# Patient Record
Sex: Male | Born: 1950 | Race: White | Hispanic: No | Marital: Married | Smoking: Never smoker
Health system: Southern US, Academic
[De-identification: ages and names within clinical notes are randomized; demographics above are authoritative.]

## PROBLEM LIST (undated history)

## (undated) DIAGNOSIS — I4891 Unspecified atrial fibrillation: Secondary | ICD-10-CM

## (undated) DIAGNOSIS — I10 Essential (primary) hypertension: Secondary | ICD-10-CM

## (undated) HISTORY — DX: Unspecified atrial fibrillation (CMS HCC): I48.91

## (undated) HISTORY — PX: HX NOSE/SINUS SURGERY: 2100001179

## (undated) HISTORY — DX: Essential (primary) hypertension: I10

---

## 1998-07-07 ENCOUNTER — Ambulatory Visit (INDEPENDENT_AMBULATORY_CARE_PROVIDER_SITE_OTHER): Payer: Self-pay

## 1998-07-19 ENCOUNTER — Ambulatory Visit (HOSPITAL_COMMUNITY): Payer: Self-pay

## 1998-07-28 ENCOUNTER — Ambulatory Visit (HOSPITAL_COMMUNITY): Payer: Self-pay

## 1998-11-27 ENCOUNTER — Ambulatory Visit (INDEPENDENT_AMBULATORY_CARE_PROVIDER_SITE_OTHER): Payer: Self-pay

## 1999-03-27 ENCOUNTER — Emergency Department (HOSPITAL_COMMUNITY): Payer: Self-pay

## 1999-03-30 ENCOUNTER — Ambulatory Visit (HOSPITAL_COMMUNITY): Payer: Self-pay

## 1999-03-30 ENCOUNTER — Ambulatory Visit (INDEPENDENT_AMBULATORY_CARE_PROVIDER_SITE_OTHER): Payer: Self-pay

## 1999-11-05 ENCOUNTER — Ambulatory Visit (INDEPENDENT_AMBULATORY_CARE_PROVIDER_SITE_OTHER): Payer: Self-pay

## 1999-11-06 ENCOUNTER — Ambulatory Visit (INDEPENDENT_AMBULATORY_CARE_PROVIDER_SITE_OTHER): Payer: Self-pay | Admitting: SURGERY OF THE HAND

## 1999-11-06 ENCOUNTER — Other Ambulatory Visit: Payer: Self-pay

## 1999-12-11 ENCOUNTER — Ambulatory Visit (INDEPENDENT_AMBULATORY_CARE_PROVIDER_SITE_OTHER): Payer: Self-pay | Admitting: SURGERY OF THE HAND

## 2000-01-08 ENCOUNTER — Ambulatory Visit (INDEPENDENT_AMBULATORY_CARE_PROVIDER_SITE_OTHER): Payer: Self-pay | Admitting: SURGERY OF THE HAND

## 2000-01-31 ENCOUNTER — Ambulatory Visit (HOSPITAL_COMMUNITY): Payer: Self-pay | Admitting: SURGERY OF THE HAND

## 2000-02-06 ENCOUNTER — Ambulatory Visit (HOSPITAL_COMMUNITY): Payer: Self-pay | Admitting: SURGERY OF THE HAND

## 2003-02-23 ENCOUNTER — Ambulatory Visit (INDEPENDENT_AMBULATORY_CARE_PROVIDER_SITE_OTHER): Payer: Self-pay | Admitting: Orthopaedic Surgery

## 2003-03-14 ENCOUNTER — Ambulatory Visit (INDEPENDENT_AMBULATORY_CARE_PROVIDER_SITE_OTHER): Payer: Self-pay | Admitting: Orthopaedic Surgery

## 2008-05-06 ENCOUNTER — Ambulatory Visit (INDEPENDENT_AMBULATORY_CARE_PROVIDER_SITE_OTHER): Payer: Self-pay | Admitting: PREVENTIVE MEDICINE

## 2008-05-06 DIAGNOSIS — Z0289 Encounter for other administrative examinations: Secondary | ICD-10-CM

## 2010-05-11 ENCOUNTER — Ambulatory Visit (INDEPENDENT_AMBULATORY_CARE_PROVIDER_SITE_OTHER): Payer: Self-pay | Admitting: PREVENTIVE MEDICINE

## 2010-05-11 DIAGNOSIS — Z0000 Encounter for general adult medical examination without abnormal findings: Secondary | ICD-10-CM

## 2010-05-11 NOTE — Progress Notes (Signed)
Patient here for work-related exam.  See documentation in paper chart.    Verlin Dike, DO 05/11/2010, 10:34 AM

## 2010-05-16 NOTE — Progress Notes (Signed)
Patient here for work-related exam.  See documentation in paper chart.    Paul Duarte 05/16/2010, 2:25 PM

## 2010-08-09 ENCOUNTER — Other Ambulatory Visit (HOSPITAL_COMMUNITY): Payer: Self-pay

## 2010-08-11 ENCOUNTER — Ambulatory Visit
Admission: RE | Admit: 2010-08-11 | Discharge: 2010-08-11 | Disposition: A | Discharge status: Home / Self Care | Payer: Commercial Managed Care - PPO | Source: Ambulatory Visit

## 2011-10-23 ENCOUNTER — Other Ambulatory Visit (HOSPITAL_COMMUNITY): Payer: Self-pay | Admitting: Otolaryngology

## 2011-11-12 ENCOUNTER — Ambulatory Visit (HOSPITAL_COMMUNITY): Payer: Commercial Managed Care - PPO

## 2011-11-12 ENCOUNTER — Ambulatory Visit
Admission: RE | Admit: 2011-11-12 | Discharge: 2011-11-12 | Disposition: A | Discharge status: Home / Self Care | Payer: Commercial Managed Care - PPO | Source: Ambulatory Visit | Attending: Otolaryngology | Admitting: Otolaryngology

## 2011-11-12 DIAGNOSIS — R51 Headache: Secondary | ICD-10-CM | POA: Insufficient documentation

## 2011-11-12 DIAGNOSIS — J0100 Acute maxillary sinusitis, unspecified: Secondary | ICD-10-CM | POA: Insufficient documentation

## 2011-11-12 DIAGNOSIS — R0982 Postnasal drip: Secondary | ICD-10-CM | POA: Insufficient documentation

## 2012-05-01 ENCOUNTER — Ambulatory Visit (INDEPENDENT_AMBULATORY_CARE_PROVIDER_SITE_OTHER): Payer: Self-pay | Admitting: PREVENTIVE MEDICINE

## 2012-05-01 DIAGNOSIS — Z0000 Encounter for general adult medical examination without abnormal findings: Secondary | ICD-10-CM

## 2012-05-01 NOTE — Progress Notes (Signed)
Patient here for work-related exam.  See documentation in paper chart.    Lollie Marrow, DO 05/01/2012, 11:26 AM

## 2013-07-15 ENCOUNTER — Ambulatory Visit (INDEPENDENT_AMBULATORY_CARE_PROVIDER_SITE_OTHER): Payer: Commercial Managed Care - PPO | Admitting: Otolaryngology

## 2013-07-15 ENCOUNTER — Encounter (INDEPENDENT_AMBULATORY_CARE_PROVIDER_SITE_OTHER): Payer: Self-pay | Admitting: Otolaryngology

## 2013-07-15 ENCOUNTER — Ambulatory Visit (INDEPENDENT_AMBULATORY_CARE_PROVIDER_SITE_OTHER): Payer: Commercial Managed Care - PPO | Admitting: Audiologist

## 2013-07-15 VITALS — BP 142/80 | HR 62 | Temp 97.5°F | Resp 18 | Ht 69.69 in | Wt 251.0 lb

## 2013-07-15 DIAGNOSIS — H9120 Sudden idiopathic hearing loss, unspecified ear: Secondary | ICD-10-CM

## 2013-07-15 NOTE — H&P (Signed)
[facility], [facility]  [address]  [place] [id number]  [phone]    PATIENT NAME:  Paul LawlessMark Vincent Duarte  MRN:  [id number]  DOB:  09-Mar-1951  DATE OF SERVICE: 07/15/2013    Chief Complaint:  Hearing Loss    HPI:  Paul LawlessMark Vincent Duarte is a 63 y.o. male who twice in the past three weeks had what he felt was 90% decreased hearing in the right ear.  This occurred when he awoke one morning but then over a 24 hour period the hearing returned.  Interestingly both times this happened the night before he used a fairly high dose of Viagra.  He researched this and found that Viagra can cause hearing loss.  He feels his hearing is back to normal but wanted to have this checked out anyway.     Past Medical History:  Past Medical History   Diagnosis Date    HTN (hypertension)        Past Surgical History:  Past Surgical History   Procedure Laterality Date    Hx nose/sinus surgery         Family History:  Family History   Problem Relation Age of Onset    Cancer Brother     Diabetes Paternal Grandfather     Anesth Problems Neg Hx     Bleeding Prob Neg Hx     Heart Disease Neg Hx     Hypertension Neg Hx     Cancer Brother        Social History:  History   Smoking status    Never Smoker    Smokeless tobacco    Never Used     History   Alcohol Use    2.5 oz/week    5 Cans of beer per week     Social History     Occupational History    Not on file.       Medications:  Outpatient Prescriptions Marked as Taking for the 07/15/13 encounter (Office Visit) with Artis DelaySnider, Samary Shatz Everett Jr., MD   Medication Sig    allopurinol (ZYLOPRIM) 300 mg Oral Tablet     atorvastatin (LIPITOR) 40 mg Oral Tablet     metoprolol succinate (TOPROL-XL) 100 mg Oral Tablet Sustained Release 24 hr        Allergies:  Allergies   Allergen Reactions    No Known Drug Allergies        Review of Systems:  Do you have any fevers: no   Any weight change: no   Change in your vision: no    Chest Pain: no   Shortness  of Breath: no   Stomach pain: no   Urinary difficulity: no   Joint Pain: no   Skin Problems: no   Weakness or Numbness: no   Easy Bruising or Bleeding: no   Excessive Thirst: no   Seasonal Allergies: yes Explain Seasonal Allergies: spring  All other systems reviewed and found to be negative.    Physical Exam:  Blood pressure 142/80, pulse 62, temperature 36.4 C (97.5 F), resp. rate 18, height 1.77 m (5' 9.69"), weight 113.853 kg (251 lb), SpO2 98.00%.  Body mass index is 36.34 kg/(m^2).  General Appearance: Pleasant, cooperative, healthy, and in no acute distress.  He is oriented x3.  Eyes: Conjunctivae/corneas clear, EOM's intact.  Head and Face: Normocephalic, atraumatic.  Face symmetric, no obvious lesions.   Pinnae: Normal shape and position.   External auditory canals:  Patent without  inflammation.  Tympanic membranes:  Intact, translucent, midposition, middle ear aerated.  Nose:  External pyramid midline. Septum midline. Mucosa normal. No purulence, polyps, or crusts.   Oral Cavity/Oropharynx: No mucosal lesions, masses, or pharyngeal asymmetry.  Tonsils are atrophic.   Nasopharynx/Hypopharynx/Larynx: Indirect exam was unremarkable.   Neck:  No thyromegaly or adenopathy.  Skin: Skin warm and dry.  Neurologic: Cranial nerves:  grossly intact.    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  Tympanometry is normal.  Speech reception and word recognition scores are normal.    Assessment:    (1) Sudden temporary hearing loss probably related to Viagra use.    Plan:    (1) I recommend caution in that regard and return p.r.n.  I told him if his hearing changes again to return immediately.      Artis Delay., MD    SDR  04.02.15

## 2013-07-15 NOTE — Procedures (Signed)
AUDIOGRAM  Patient is being seen today by Dr Drue SecondSnider. He reports that in the past two and a half weeks, he has woken up to a significant decline in his hearing. Both times the hearing has returned. Patient reports that today his hearing is good. Type A tympanograms were obtained AU. 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 The hearing loss AS > AD.    Rec: ENT f/u, audio prn;                                      TRH

## 2013-10-06 ENCOUNTER — Ambulatory Visit (INDEPENDENT_AMBULATORY_CARE_PROVIDER_SITE_OTHER): Payer: Commercial Managed Care - PPO | Admitting: Audiologist

## 2013-10-06 ENCOUNTER — Ambulatory Visit (INDEPENDENT_AMBULATORY_CARE_PROVIDER_SITE_OTHER): Payer: Commercial Managed Care - PPO | Admitting: Otolaryngology

## 2013-10-06 VITALS — BP 128/76 | HR 66 | Temp 97.0°F | Ht 69.69 in | Wt 248.2 lb

## 2013-10-06 DIAGNOSIS — R42 Dizziness and giddiness: Secondary | ICD-10-CM

## 2013-10-06 DIAGNOSIS — H8120 Vestibular neuronitis, unspecified ear: Secondary | ICD-10-CM

## 2013-10-06 DIAGNOSIS — H9190 Unspecified hearing loss, unspecified ear: Secondary | ICD-10-CM

## 2013-10-06 NOTE — Progress Notes (Signed)
[facility], [facility]  [address]  [place] [id number]  [phone]    PATIENT NAME:  Danny LawlessMark Vincent Santmyer  MRN:  [id number]  DOB:  12-Jun-1950  DATE OF SERVICE: 10/06/2013    HPI:  Danny LawlessMark Vincent Aguallo is a 63 y.o. year old male who comes in today because two weeks ago he had sudden right sided hearing loss.  He just noticed this one evening where he felt his hearing was down 90% from before.  He went to bed around 10 p.m. and woke at 1 a.m. with whirling vertigo, nausea and vomiting.  When he woke the following morning his hearing was back to normal and the balance issue was gone.  However, he has felt off since that time and decided to come in to have things checked out.  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  On his last visit here on 07/15/13 he had an audiogram, which showed a mild high frequency loss in both ears, left greater than right.     Physical Exam:  Blood pressure 128/76, pulse 66, temperature 36.1 C (97 F), height 1.77 m (5' 9.69"), weight 112.583 kg (248 lb 3.2 oz).  Body mass index is 35.94 kg/(m^2).  On exam head is normocephalic.  Facies are symmetric.  External ears, canals and tympanic membranes are normal.  No nystagmus is noted.  Mouth and oropharynx are unremarkable.  Neck is without adenopathy.  Skin is warm and dry.  Cranial nerves are grossly intact.    Procedure:  No notes on file    Data Reviewed:  Audiogram today shows a speech reception of 10 dB bilaterally and word recognition of 100% bilaterally.  This is the same as before.  Tympanometry is normal.  Pure tones show a drop at 4,000 cycles per second of about 10 dB from the last test done in April.  Otherwise the tests are the same.    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  I think this will take care of itself given  time.    Plan:    (1) No specific therapy is indicated at this juncture.  We will see him back p.r.n.    Artis DelayGeorge Everett Snider Jr., MD    SDR  06.25.15

## 2013-10-06 NOTE — Procedures (Signed)
AUDIOGRAM  Patient is being seen today by Dr Drue SecondSnider. He states that two weeks ago, he had a sudden drop in the hearing in his right ear. He reports that it seems like his hearing has returned to what it was. Type A tympanograms were obtained AU. Results are similar to those obtained in April 2015. Results indicate a mild to moderate high frequency SNHL AU.    Rec: ENT f/u, audio prn;                                       TRH

## 2014-05-03 ENCOUNTER — Ambulatory Visit (INDEPENDENT_AMBULATORY_CARE_PROVIDER_SITE_OTHER): Payer: Self-pay | Admitting: PREVENTIVE MEDICINE

## 2014-05-03 ENCOUNTER — Ambulatory Visit (INDEPENDENT_AMBULATORY_CARE_PROVIDER_SITE_OTHER): Payer: Self-pay

## 2014-05-03 VITALS — BP 132/88 | HR 81 | Temp 97.7°F | Resp 16 | Ht 70.12 in | Wt 247.4 lb

## 2014-05-03 DIAGNOSIS — Z0000 Encounter for general adult medical examination without abnormal findings: Secondary | ICD-10-CM

## 2014-05-03 NOTE — Progress Notes (Signed)
Patient here for work-related exam.  See documentation in paper chart.    Paul Duarte 05/03/2014, 11:04

## 2014-05-03 NOTE — Progress Notes (Signed)
Medication review IS NOT medically indicated for this type of encounter and were NOT completed as part of this encounter.  Marked ONLY so encounter could be closed in [place].

## 2014-05-03 NOTE — Progress Notes (Signed)
Patient here for work-related exam.  See documentation in paper chart.    Paul L Werntz, DO 05/03/2014, 13:09

## 2017-04-18 ENCOUNTER — Ambulatory Visit (INDEPENDENT_AMBULATORY_CARE_PROVIDER_SITE_OTHER): Payer: Self-pay | Admitting: NURSE PRACTITIONER, FAMILY

## 2017-07-27 ENCOUNTER — Encounter (INDEPENDENT_AMBULATORY_CARE_PROVIDER_SITE_OTHER): Payer: Self-pay

## 2017-07-27 ENCOUNTER — Telehealth (INDEPENDENT_AMBULATORY_CARE_PROVIDER_SITE_OTHER): Payer: Self-pay | Admitting: Family Medicine

## 2017-07-27 ENCOUNTER — Ambulatory Visit (INDEPENDENT_AMBULATORY_CARE_PROVIDER_SITE_OTHER): Payer: Medicare Other

## 2017-07-27 DIAGNOSIS — S20212A Contusion of left front wall of thorax, initial encounter: Secondary | ICD-10-CM

## 2017-07-27 DIAGNOSIS — Z7901 Long term (current) use of anticoagulants: Secondary | ICD-10-CM | POA: Insufficient documentation

## 2017-07-27 DIAGNOSIS — R0781 Pleurodynia: Secondary | ICD-10-CM

## 2017-07-27 DIAGNOSIS — S2232XA Fracture of one rib, left side, initial encounter for closed fracture: Secondary | ICD-10-CM

## 2017-07-27 MED ORDER — TRAMADOL 50 MG TABLET
1.00 | ORAL_TABLET | Freq: Four times a day (QID) | ORAL | 0 refills | Status: AC | PRN
Start: 2017-07-27 — End: ?

## 2017-07-27 NOTE — Telephone Encounter (Signed)
Final read returned from Xray showing a nondisplaced 4th rib fracture. Discussed results with patient via phone. Questions answered and will continue ICE/heat    Daylene KatayamaJustin Mirza Fessel, MD  07/27/2017, 18:12

## 2017-07-27 NOTE — Patient Instructions (Addendum)
[facility]      Operated by [facility]  [address]  [place], [place] [id number]  Phone: [phone]  Fax: [phone]  [email or web address]  Open Daily 8:00a - 8:00p    Closed Thanksgiving and Christmas Day  [facility]     Operated by [facility]  [address]  [facility]  [place], [place] [address]  Phone: [phone]  Fax: [phone]  [email or web address]  Open M-F  8:00a - 8:00p  Sat              10:00a - 4:00p  Sun             Closed  Closed all [place] Holidays        Attending Caregiver: Daylene KatayamaJustin Matilde Pottenger, MD      Today's orders:   Orders Placed This Encounter   . XR RIBS LEFT W PA CHEST   . traMADol (ULTRAM) 50 mg Oral Tablet        Prescription(s) E-Rx to:  [facility] - [place], [place] - [address]    ________________________________________________________________________  Short Term Disability and Family Medical Leave Act  [facility] does NOT provide assistance with any disability applications.  If you feel your medical condition requires you to be on disability, you will need to follow up with  your primary care physician or a specialist.  We apologize for any inconvenience.    For Medication Prescribed by [facility]:  As an Urgent Care facility, our clinic does NOT offer prescription refills over the telephone.    If you need more of the medication one of our medical providers prescribed, you will  either need to be re-evaluated by us or see your primary care physician.    ________________________________________________________________________      It is very important that we have a phone number.  This is the single best way to contact you in the event that we become aware of important clinical information or concerns after your discharge.  If the phone number you provided at registration is NOT this number you should  inform staff and registration prior to leaving.      Your treatment and evaluation today was focused on identifying and treating potentially emergent conditions based on your presenting signs, symptoms, and history.  The resulting initial clinical impression and treatment plan is not intended to be definitive or a substitute for a full physical examination and evaluation by your primary care provider.  If your symptoms persist, worsen, or you develop any new or concerning symptoms, you need to be evaluated.      If you received x-rays during your visit, be aware that the final and formal interpretation of those films by a radiologist may occur after your discharge.  If there is a significant discrepancy identified after your discharge, we will contact you at the telephone number provided at registration.      If you received a pelvic exam, you may have cultures pending for sexually transmitted diseases.  Positive cultures are reported to the [facility] Department of Health as required by state law.  You may contact the Health Information Management Office of [facility] to get a copy of your results.     If you are over 69 year old, we cannot discuss your personal health information with a parent, spouse, family member, or anyone else without your  consent.  This does not include those who have legitimate access to your records and information to assist in your care under the provisions of HIPAA [facility] Portability and Accountability Act) law, or those to whom you have previously given written consent to do so, such a legal guardian or Power of [place].      Instructions are discussed with patient upon discharge by clinical staff with all questions answered.  Please call [facility] [phone]) if any further questions develop.  Go immediately to the emergency department if any concerns or worsening symptoms.      Daylene Katayama, MD 07/27/2017, 10:29      Rib Contusion or Minor Fracture    A rib  contusion is a bruise to one or more rib bones. It may cause pain, tenderness, swelling, and a purplish colorto the skin. There may be a sharp pain with each breath. A rib contusion takes anywhere from a few days to a few weeks to heal. A minor rib fracture or break may cause the same symptoms as a rib contusion. The small crack may not be seen on a regular chest X-ray. Treatment for both problems is basicallythe same.  Home care   You may use over-the-counter pain medicine to control pain, unless another pain medicine was prescribed. If you have chronic liver or kidney disease or ever had a stomach ulcer or GI (gastrointestinal)bleeding, talk with your healthcare provider before using these medicines.   Rest. Don't lift anything heavy or do any activity that causes pain.   Apply an ice pack over the injured area for 15 to 20 minutes every 1 to 2 hours. You should do this for the first 24 to 48 hours. To make an ice pack, put ice cubes in a plastic bag that seals at the top. Wrap the bag in a clean, thin towel or cloth. Never put ice or an ice pack directly on the skin.Continue with ice packs as needed for the relief of pain and swelling.   The first 3 to 4 weeks of healing will be the most painful. If your pain is not under control with the treatment given, call your healthcare provider. Sometimes a stronger pain medicine may be needed. A nerve block can be done in case of severe pain. It will numb the nerve between the ribs.  Follow-up care  Follow up with your healthcare provider, or as advised.  If X-rays were taken, you will be told of any new findings that may affect your care.  Call 911  Call 911 if you have:   Dizziness, weakness or fainting   Shortness of breath with or without chest discomfort   New or worsening pain  When to seek medical advice  Call your healthcare provider right away if any of these occur:   Fever of 100.40F (38C) or higher, or as directed by your healthcare  provider   Chills   Stomach pain, vomiting  Date Last Reviewed: 09/13/2016   2000-2018 [employer], [place]. [address] [place], [place] [address]. All rights reserved. This information is not intended as a substitute for professional medical care. Always follow your healthcare professional's instructions.
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
## 2020-04-13 IMAGING — MR MRI LUMBAR SPINE WITHOUT CONTRAST
4 of 5 series · 14 of 48 positions shown · IV contrast (gadolinium)
Comparison: None

MRI LUMBAR SPINE WITHOUT CONTRAST, 04/13/2020 [DATE]: 
CLINICAL INDICATION: Spondylosis. Low back pain.
TECHNIQUE: Multiplanar, multiecho position MR images of the lumbar spine were 
performed without intravenous gadolinium enhancement.

[Series 101: survey · axial · 10.0mm · 1.39mm/px · z∈[-15,+199]mm · 3 of 9 slices shown]
[im 1/9]
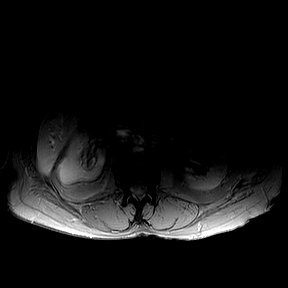
[im 5/9]
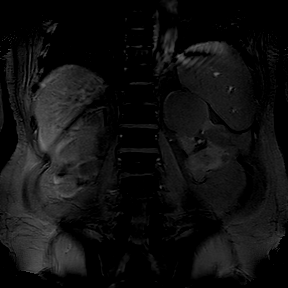
[im 9/9]
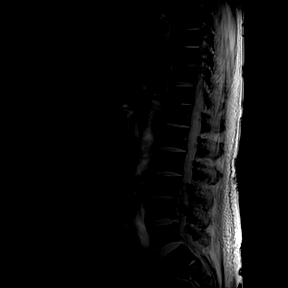

[Series 301: t2w_tse sag · sagittal · 4.0mm · 0.35mm/px · 5 of 17 slices shown]
[im 1/17]
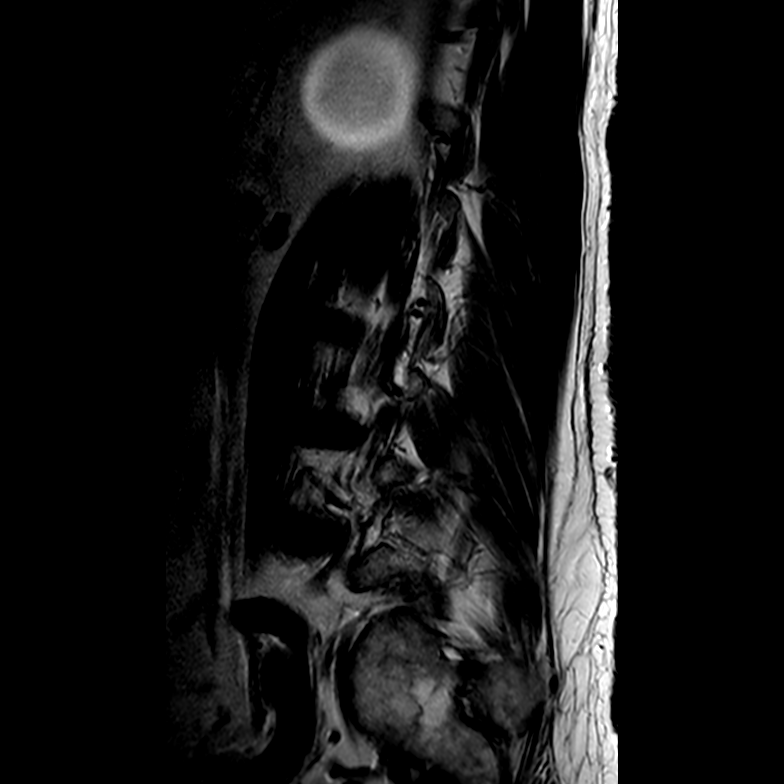
[im 3/17]
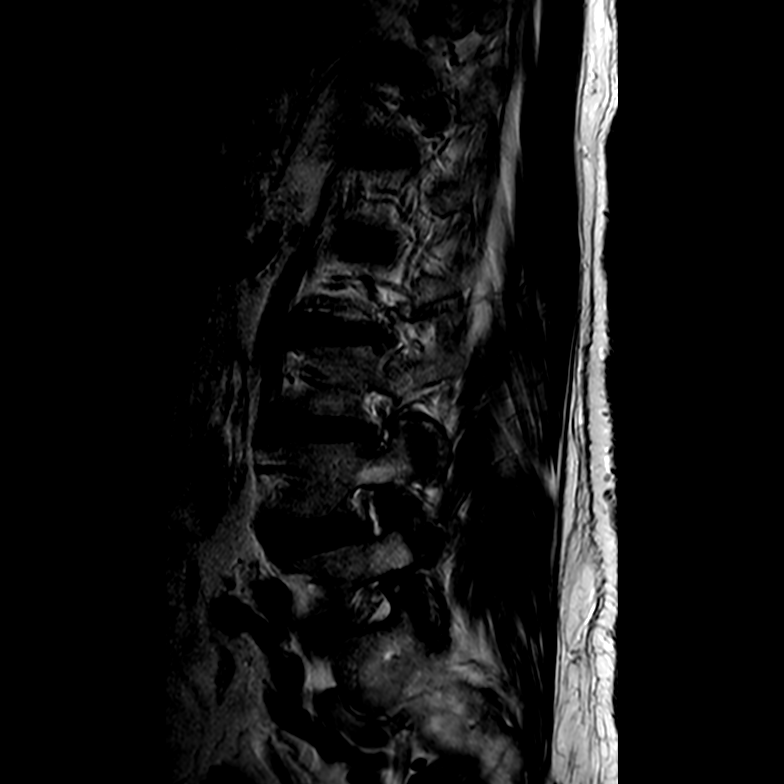
[im 6/17]
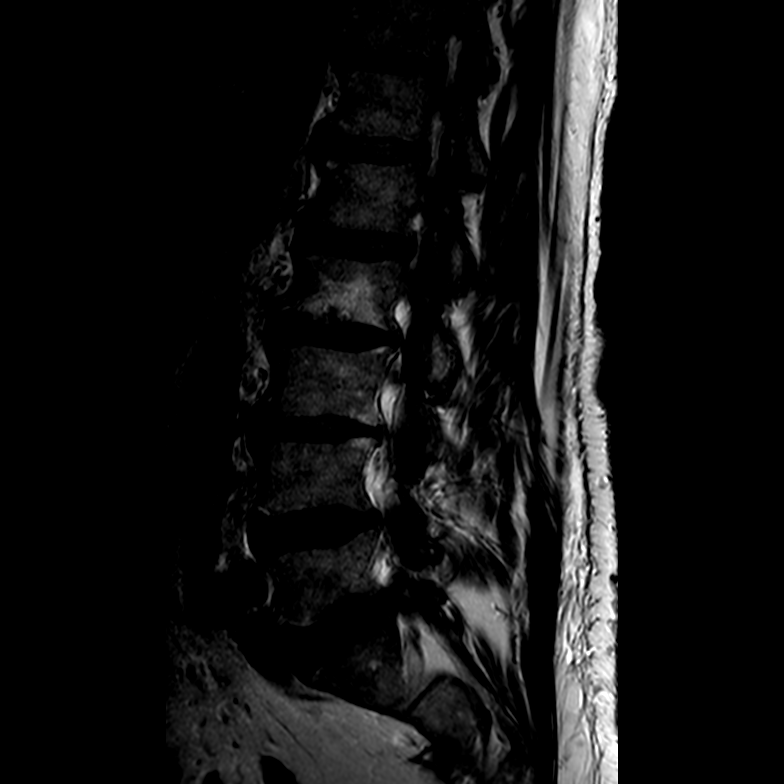
[im 9/17]
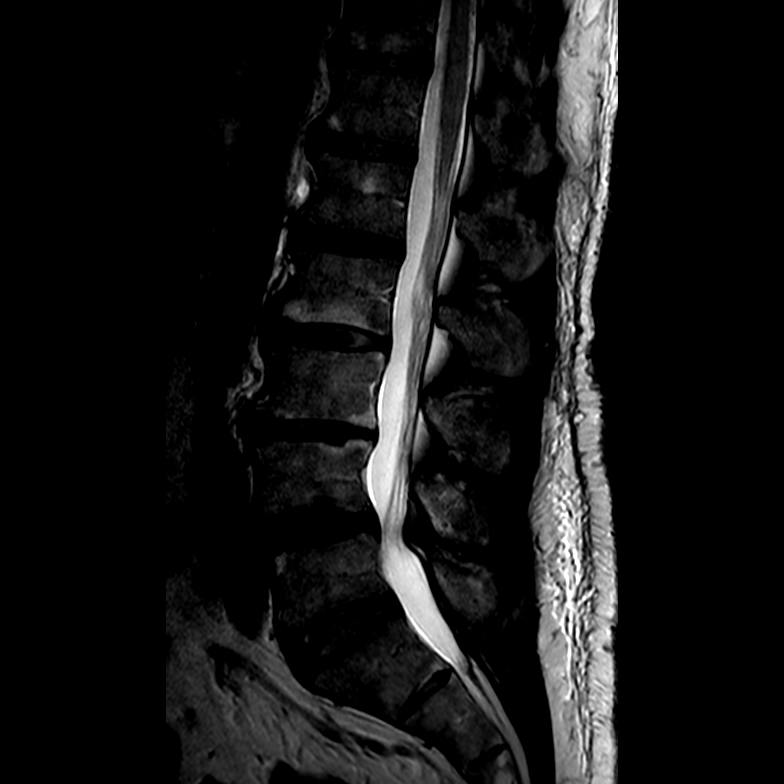
[im 14/17]
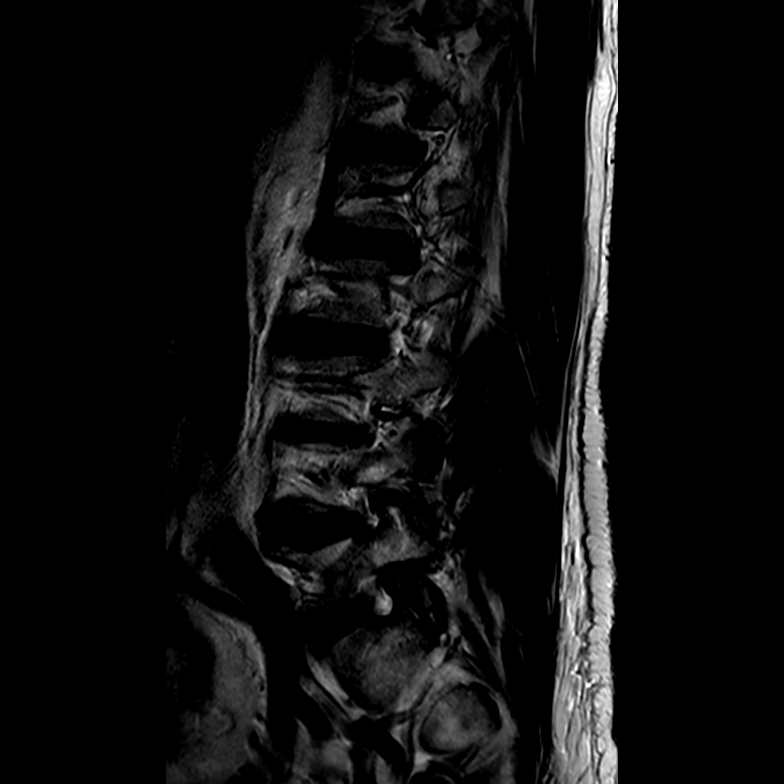

[Series 401: t1w_tse sag · sagittal · 4.0mm · 0.54mm/px · 3 of 17 slices shown]
[im 3/17]
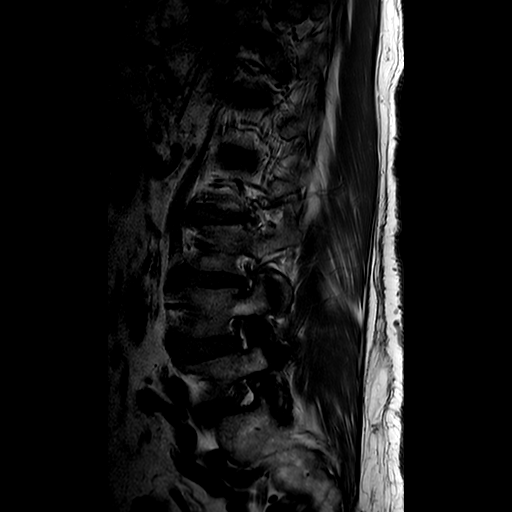
[im 9/17]
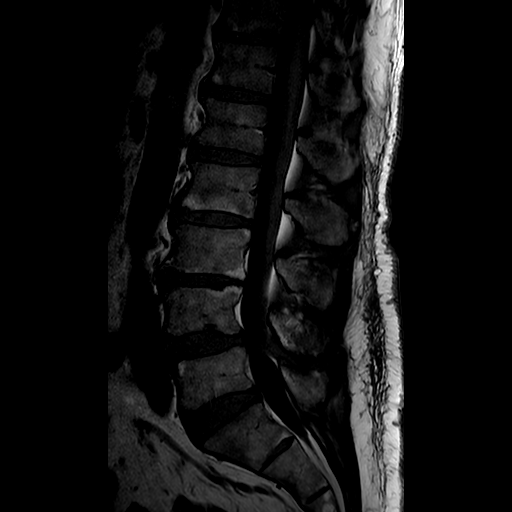
[im 14/17]
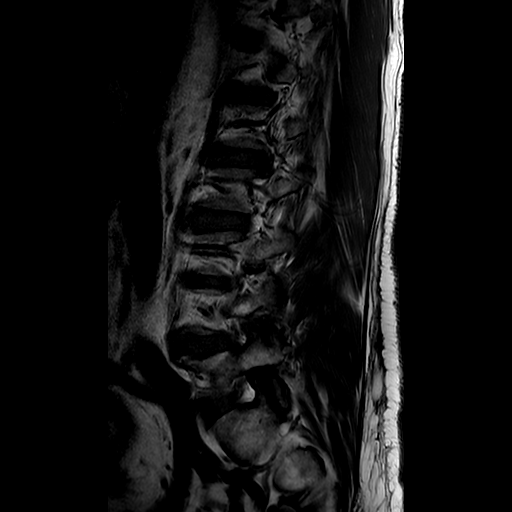

[Series 601: t2w_tse_ax-mst · axial · 4.0mm · 0.35mm/px · z∈[-166,+10]mm · 3 of 30 slices shown]
[im 5/30]
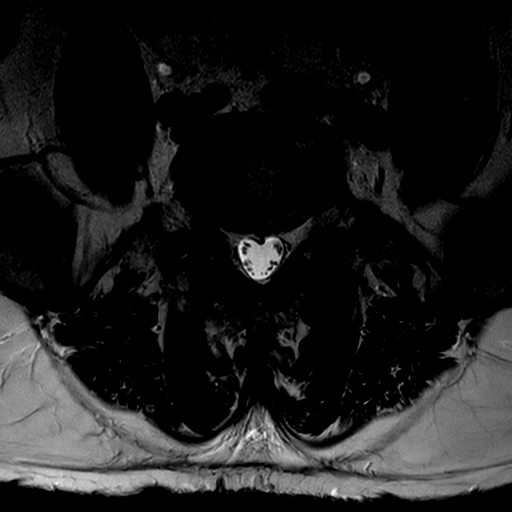
[im 15/30]
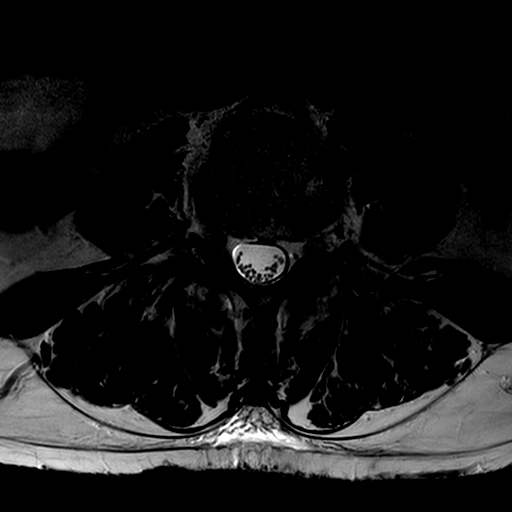
[im 25/30]
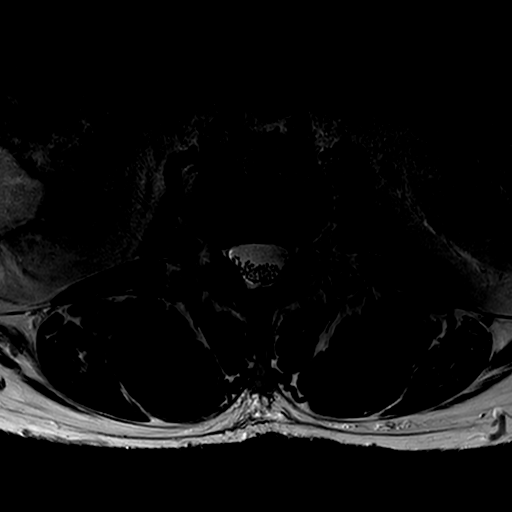

[14 of 48 positions shown; findings below may reference images not displayed]

FINDINGS: Nomenclature is based on 5 lumbar type vertebral bodies.  The 
vertebral bodies are normal in height and alignment. No acute vertebral body 
fracture. 2 mm anterior positioning of L4 upon L5. Multifocal degenerative 
change with marginal osteophytes. L2-4 Schmorl's nodes and mild multilevel type 
II Modic changes. No abnormal marrow signal intensity. No pars defect.  The 
conus tip terminates at the L1 vertebral body level. 
T12-L1: The disc is normal in height and signal. No disc herniation. Mild facet 
arthropathy. No spinal canal or right neural foraminal stenosis. Mild left 
neural foraminal stenosis. 
L1-L2: The disc is normal in height and signal. No disc herniation. Normal 
facets. No spinal canal stenosis. Mild bilateral neural foraminal stenosis. 
L2-L3: The disc is normal in height and signal. No disc herniation. Mild facet 
arthropathy. No central canal or right neural foraminal stenosis. Mild bilateral 
lateral recess and left neural foraminal stenosis. 
L3-L4: Mild disc space narrowing and disc desiccation. No disc herniation. 
Normal facets. No central canal stenosis. Mild to moderate bilateral neural 
foraminal stenosis. 
L4-L5: The disc is normal in height with disc desiccation. No disc herniation. 
Moderate facet arthropathy with small synovial cysts and pericapsular edema like 
signal changes. Ligamentum flavum hypertrophy. Moderate central canal stenosis 
(thecal sac measures 0.7 cm in AP dimensions) and moderate lateral recess 
stenosis. Mild neural foraminal stenosis. 
L5-S1: The disc is normal in height and signal. No disc herniation. Moderate 
facet arthropathy. Mild central canal stenosis. Mild to moderate neural 
foraminal stenosis. 
OTHER: Multiple bilateral renal cysts. The aorta is normal in diameter. The 
posterior paraspinal soft tissues are negative.
IMPRESSION: 1.  Multifocal degenerative change. 
2.  L4-5 moderate central canal/lateral recess stenosis and mild to moderate 
multilevel neural foraminal stenosis.

## 2021-12-28 IMAGING — MR MRI BRAIN WITH  IAC W/WO CONTRAST
10 of 20 series · 27 of 48 positions shown · IV contrast (gadavist)
Comparison: None.

________________________________________________________________________________________________ 
MRI BRAIN WITH  IAC W/WO CONTRAST,12/28/2021 [DATE]: 
CLINICAL INDICATION: Dizziness and giddiness.
TECHNIQUE: Axial T1, Axial T2, Axial FLAIR, Diffusion weighted images, Sagittal 
T1, Enhanced Axial T1, and Enhanced coronal fat-suppressed T1 were obtained. 
Thin section axial T2 images through the IACs with sagittal and coronal 
reconstruction, thin section enhanced axial T1 images through the IACs with 
coronal reconstruction, enhanced coronal 2 mm images and enhanced axial images 
of the entire brain. FLAIR images of the brain. 10 mL of Gadavist were injected 
intravenously. 0 mL of Gadavist was discarded. Patient was scanned on a 3T 
magnet. The patients eGFR was calculated to be 39.8 mL/min/1.73 m2 using the 
i-STAT device..

[Series 401: FLAIR fat-sat · axial · 5.0mm · 0.56mm/px · 1 of 27 slices shown]
[im 1/27]
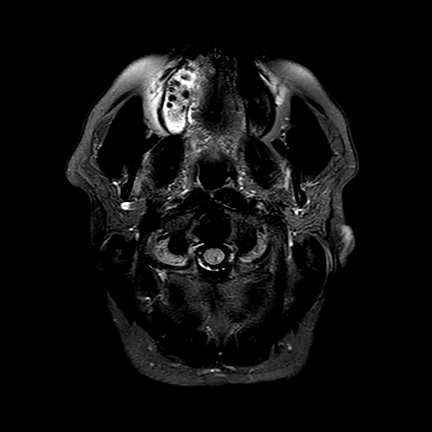

[Series 601: SWI · axial · 3.0mm · 0.50mm/px · z∈[-75,+6]mm · 2 of 110 slices shown]
[im 1/110]
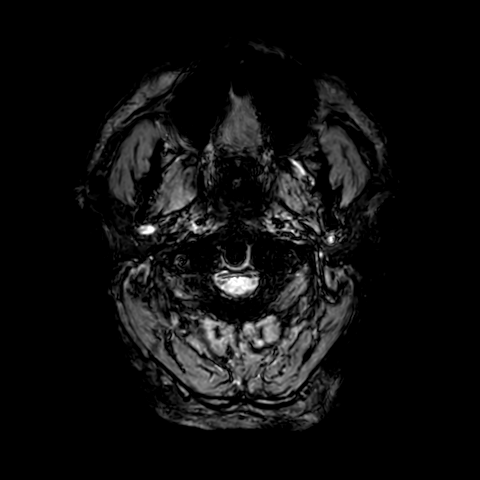
[im 55/110]
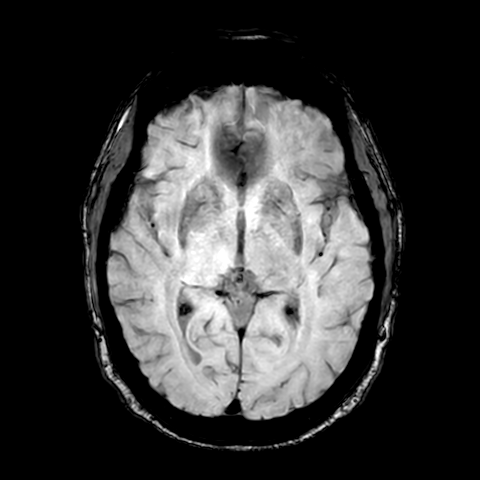

[Series 701: T2 · coronal · 4.0mm · 0.47mm/px · 1 of 15 slices shown (1 of 3)]
[im 1/15]
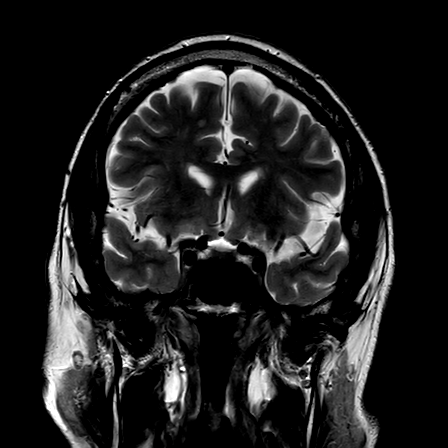

[Series 802: T2 · coronal · 0.9mm · 0.35mm/px · 3 of 71 slices shown (2 of 3)]
[im 1/71]
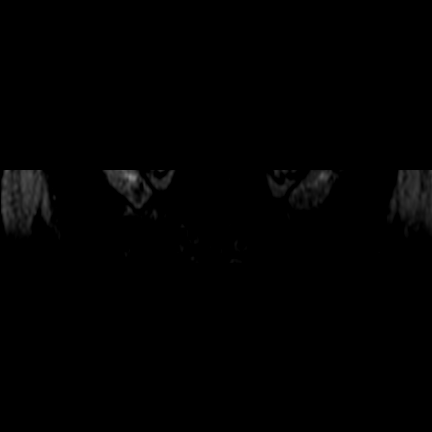
[im 36/71]
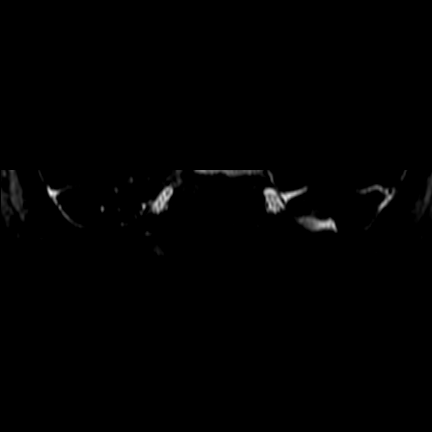
[im 71/71]
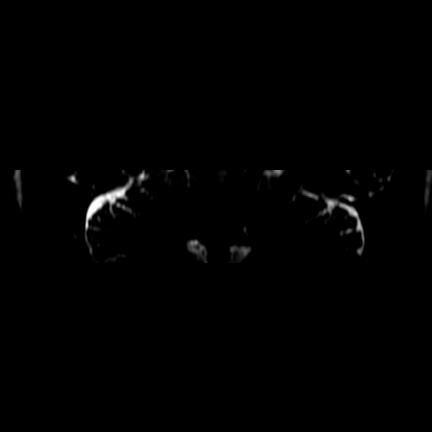

[Series 803: T2 · sagittal · 0.9mm · 0.35mm/px · 3 of 73 slices shown (3 of 3)]
[im 1/73]
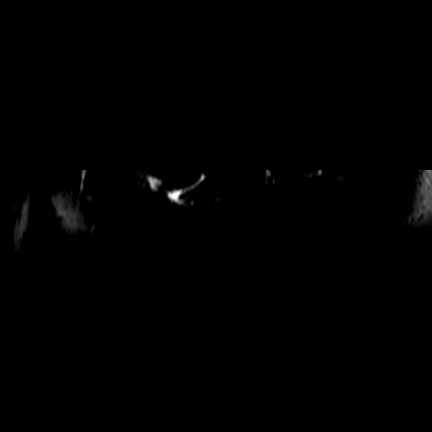
[im 37/73]
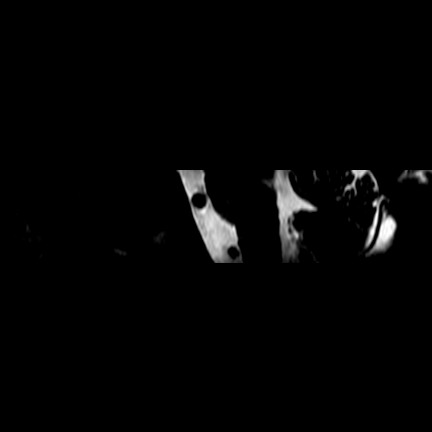
[im 73/73]
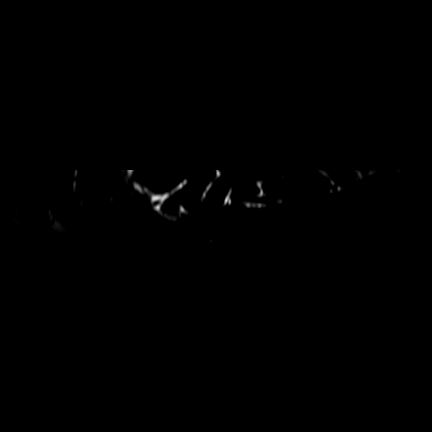

[Series 901: T2 post-contrast · axial · 5.0mm · 0.38mm/px · 1 of 27 slices shown]
[im 1/27]
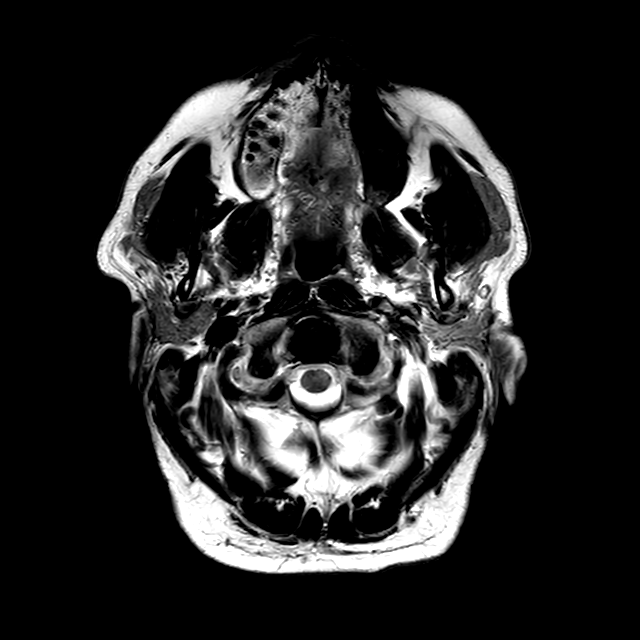

[Series 1002: T1 post-contrast · coronal · 0.9mm · 0.39mm/px · 4 of 98 slices shown (1 of 4)]
[im 1/98]
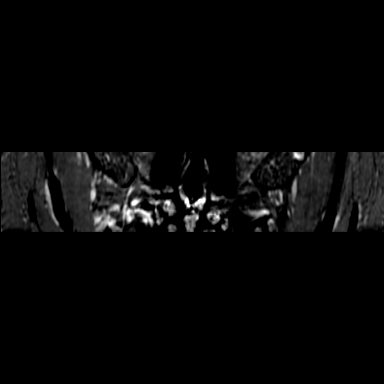
[im 33/98]
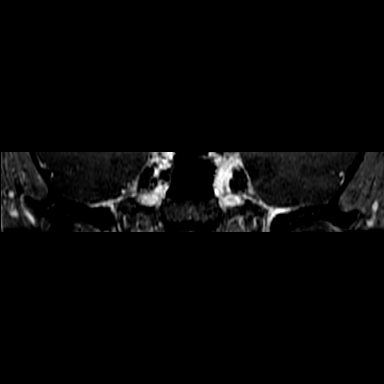
[im 65/98]
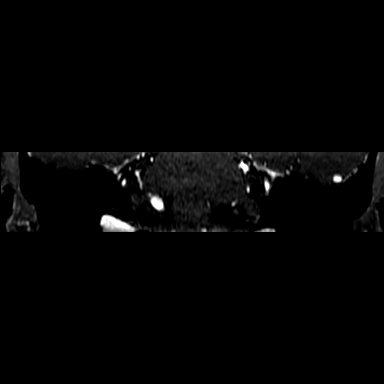
[im 98/98]
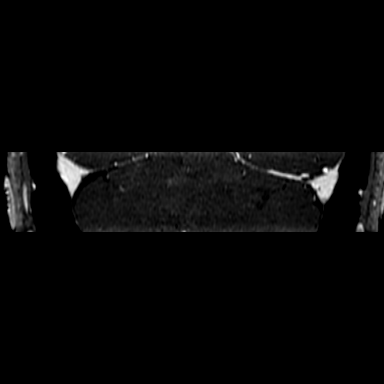

[Series 1003: T1 post-contrast · sagittal · 0.9mm · 0.39mm/px · 5 of 125 slices shown (2 of 4)]
[im 1/125]
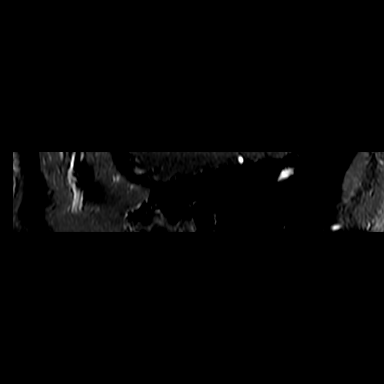
[im 32/125]
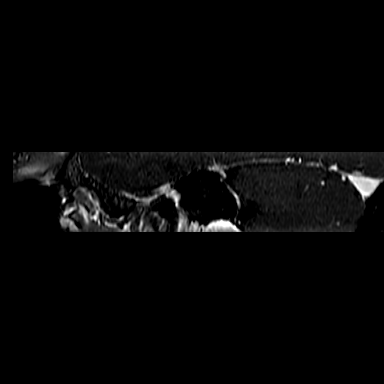
[im 63/125]
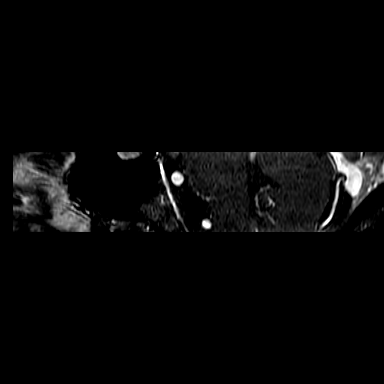
[im 94/125]
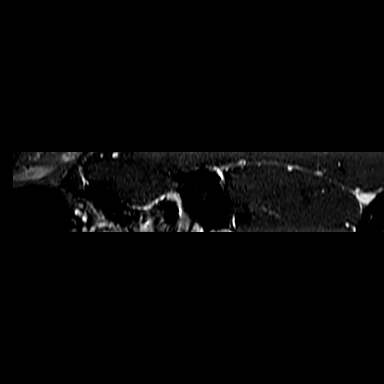
[im 125/125]
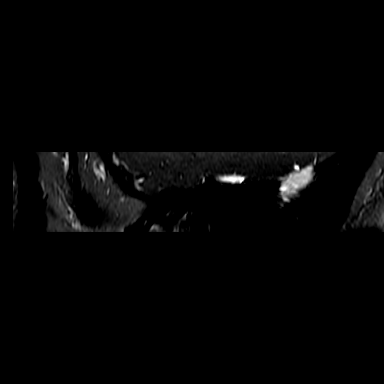

[Series 1102: T1 post-contrast · axial · 1.5mm · 0.61mm/px · z∈[-64,+99]mm · 4 of 110 slices shown (3 of 4)]
[im 1/110]
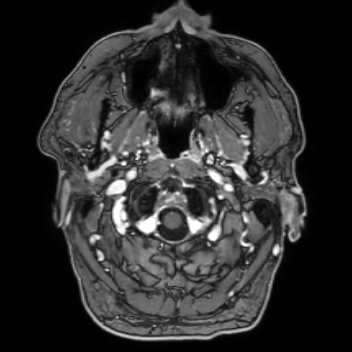
[im 37/110]
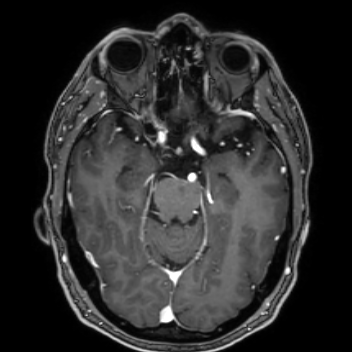
[im 73/110]
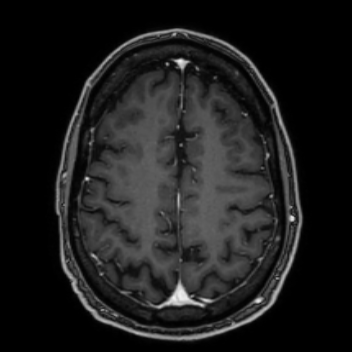
[im 110/110]
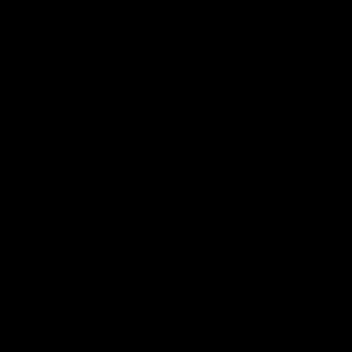

[Series 1103: T1 post-contrast · coronal · 1.5mm · 0.52mm/px · 3 of 75 slices shown (4 of 4)]
[im 1/75]
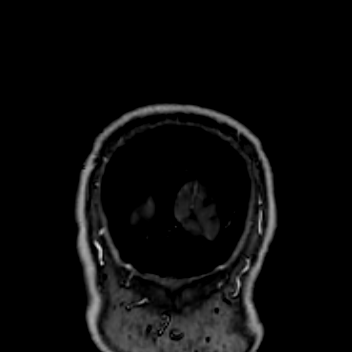
[im 38/75]
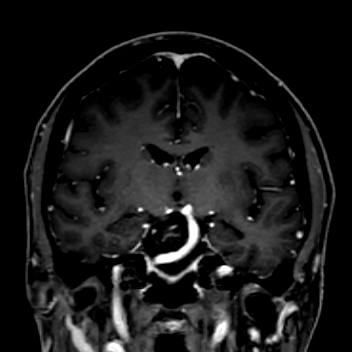
[im 75/75]
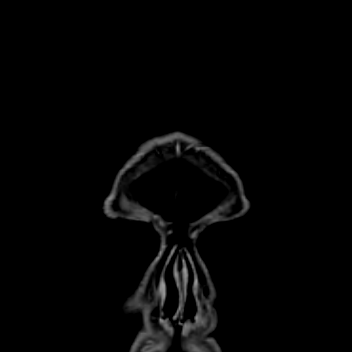

[27 of 48 positions shown; findings below may reference images not displayed]

FINDINGS: The ventricles are normal in size. There is mild sulcal enlargement indicating 
mild cortical atrophy. Mild scattered punctate chronic microangiopathic changes 
are in the cerebral white matter of the frontal and parietal lobes. There is 
evidence for bilateral chronic microinfarctions in the cerebellum. The brainstem 
is unremarkable except for mild mass effect upon the RIGHT side of the medulla 
due to a tortuous RIGHT vertebral artery. Normal flow void is in the major 
intracranial arteries on T2 sequences. No intracranial mass effect or 
extra-axial fluid collections are found. No abnormal intracranial contrast 
enhancement. Diffusion sequences show no evidence for recent ischemic 
infarction. No chronic large vascular territory infarctions are present. 
Susceptibility sequences are unremarkable. 
No filling defects or abnormal enhancement are along the 7th or 8th nerve 
complexes, cerebellopontine angle cisterns or internal auditory canals. There is 
severe tortuosity of the RIGHT vertebral artery which impinges upon the 
intracisternal segment and nerve entry zone of the RIGHT 7th and 8th nerve 
complexes. The fluid containing inner ear structures appear normal bilaterally. 
No abnormal fluid or mucosal thickening is found in the otomastoid areas. No 
evidence for significant paranasal sinus mucosal pathology. Nasopharynx appears 
normal. Orbits are normal other than evidence of prior lens replacements.
IMPRESSION: 1. No evidence for vestibular schwannoma. 
2. Tortuous RIGHT vertebral artery impinges upon the intracisternal segment and 
nerve entry zone of the RIGHT 7th and 8th nerve complexes and also produces mild 
mass effect upon the RIGHT lateral border of the medulla segment of the 
brainstem. Probably incidental but recommend clinical correlation. 
3. No acute appearing intracranial pathology. 
4. Mild diffuse cortical atrophy. Mild chronic microangiopathic cerebral white 
matter changes. Chronic bilateral microinfarctions are in the cerebellum.
